# Patient Record
Sex: Female | Born: 1970 | Race: White | Hispanic: Yes | Marital: Single | Smoking: Never smoker
Health system: Southern US, Community
[De-identification: ages and names within clinical notes are randomized; demographics above are authoritative.]

## PROBLEM LIST (undated history)

## (undated) DIAGNOSIS — E119 Type 2 diabetes mellitus without complications: Secondary | ICD-10-CM

## (undated) HISTORY — PX: TUBAL LIGATION: SHX77

---

## 2010-01-17 ENCOUNTER — Inpatient Hospital Stay: Payer: Self-pay | Admitting: Internal Medicine

## 2010-02-11 ENCOUNTER — Ambulatory Visit: Payer: Self-pay | Admitting: Internal Medicine

## 2010-12-08 ENCOUNTER — Emergency Department: Payer: Self-pay | Admitting: Emergency Medicine

## 2011-05-10 ENCOUNTER — Emergency Department: Payer: Self-pay | Admitting: Internal Medicine

## 2011-05-14 ENCOUNTER — Emergency Department: Payer: Self-pay | Admitting: Emergency Medicine

## 2011-05-20 ENCOUNTER — Emergency Department: Payer: Self-pay | Admitting: Emergency Medicine

## 2012-02-10 IMAGING — CT CT CHEST W/ CM
2 series · 16 of 32 positions shown, 20 images · non-contrast
Comparison: none

REASON FOR EXAM: Harpal Danso2
COMMENTS:

[Series 4: soft tissue · axial · 0.70mm/px · z∈[-200,-162]mm · 2 of 85 slices shown]
[im 7/85  mediastinal]
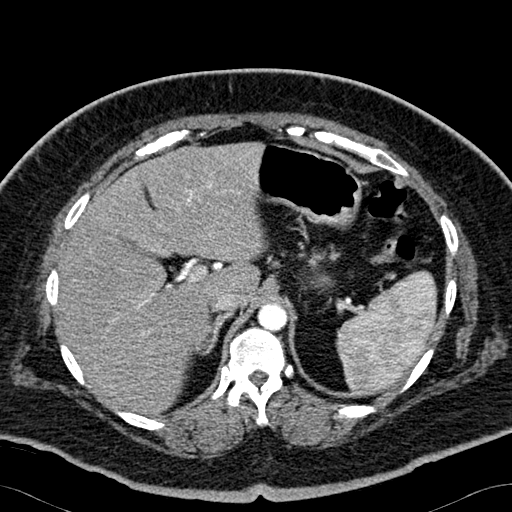
[im 20/85  mediastinal]
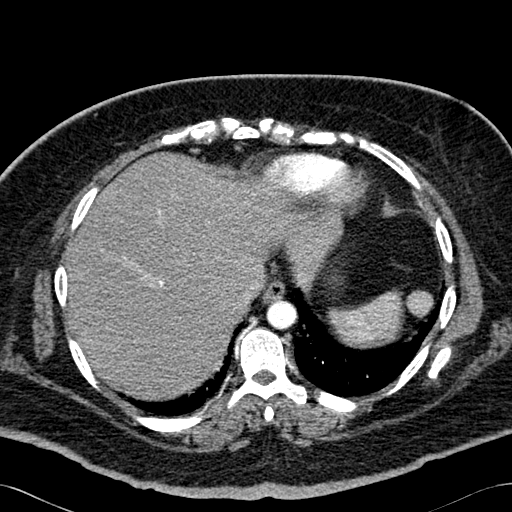

[Series 5: lung windows · axial · 0.70mm/px · z∈[-192,+12]mm · 14 of 82 slices shown, 18 images]
[im 7/82  mediastinal]
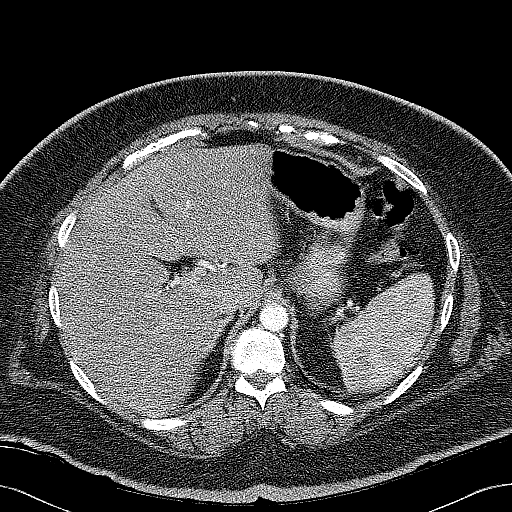
[im 7/82  lung]
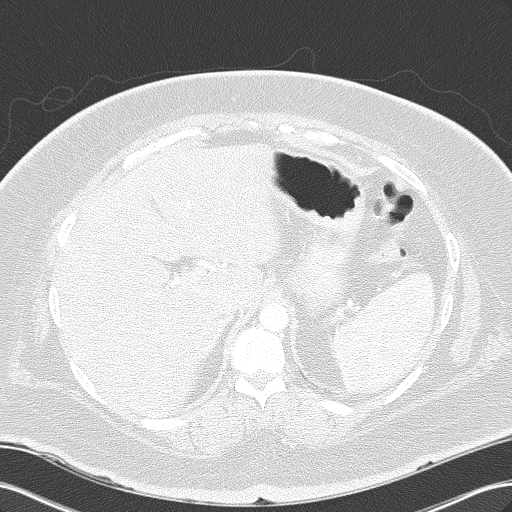
[im 13/82  lung]
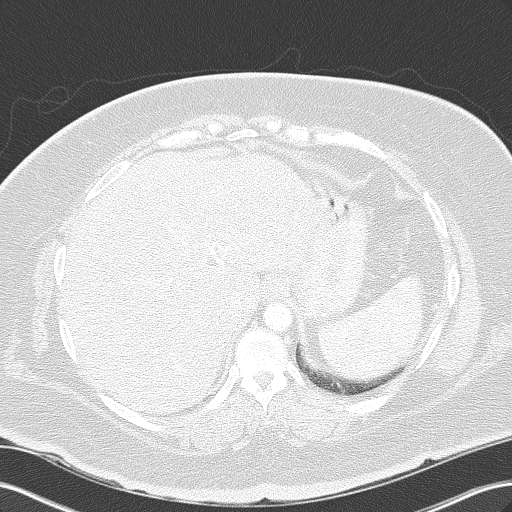
[im 19/82  lung]
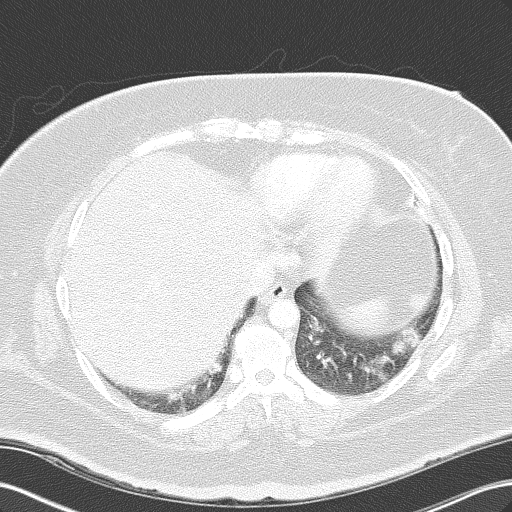
[im 25/82  lung]
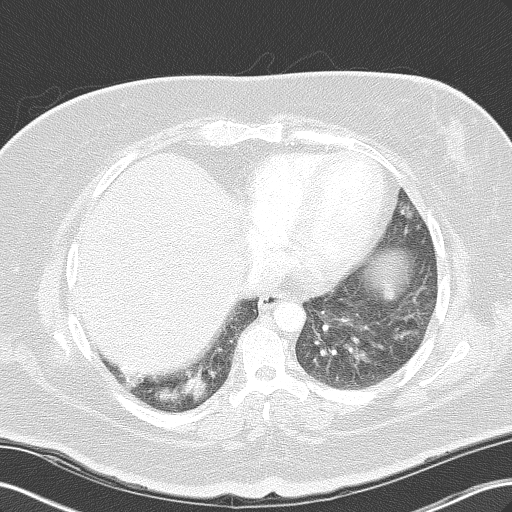
[im 32/82  mediastinal]
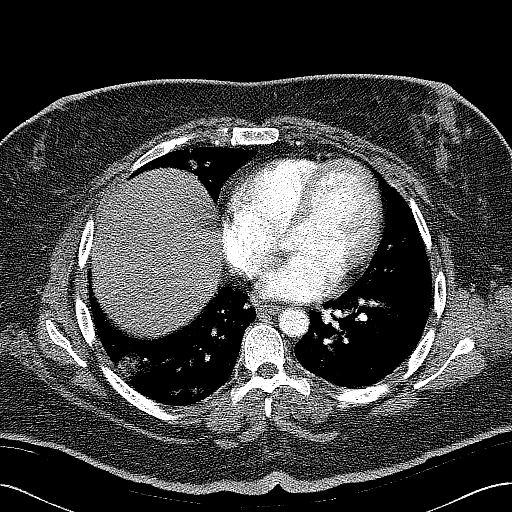
[im 32/82  lung]
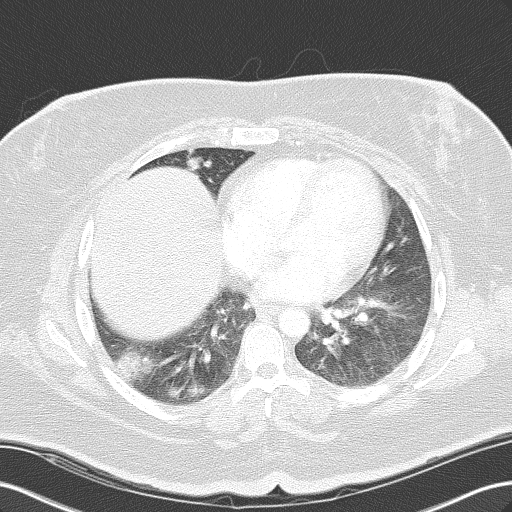
[im 37/82  lung]
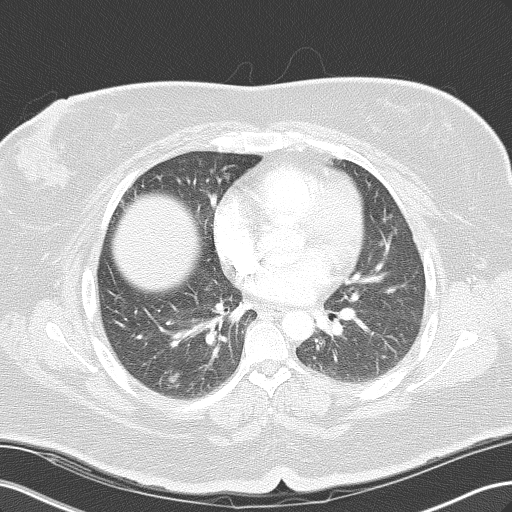
[im 38/82  lung]
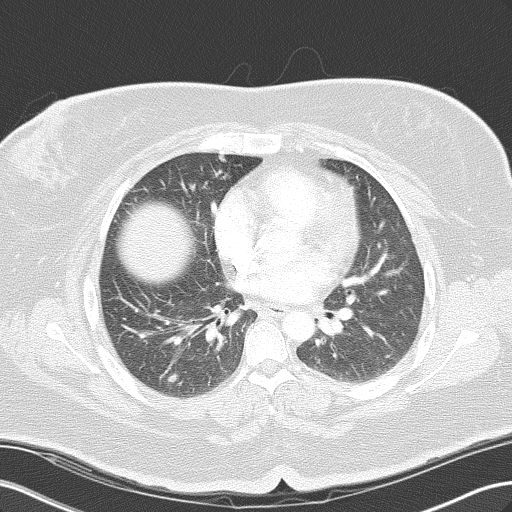
[im 41/82  lung]
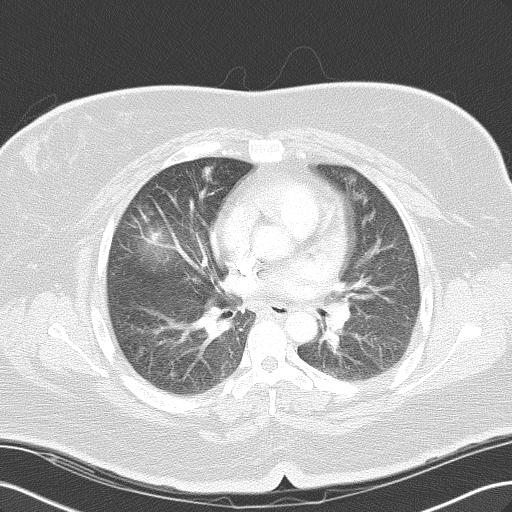
[im 44/82  mediastinal]
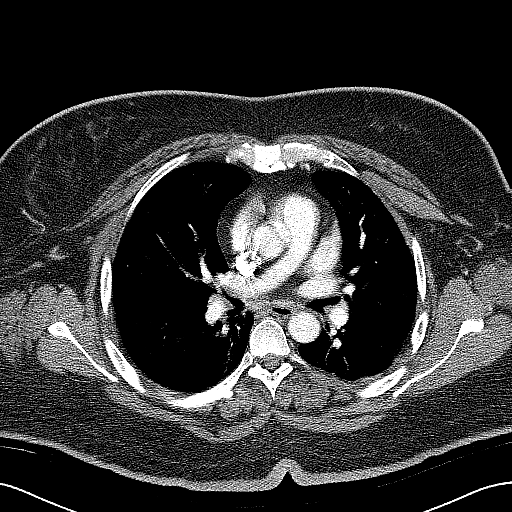
[im 44/82  lung]
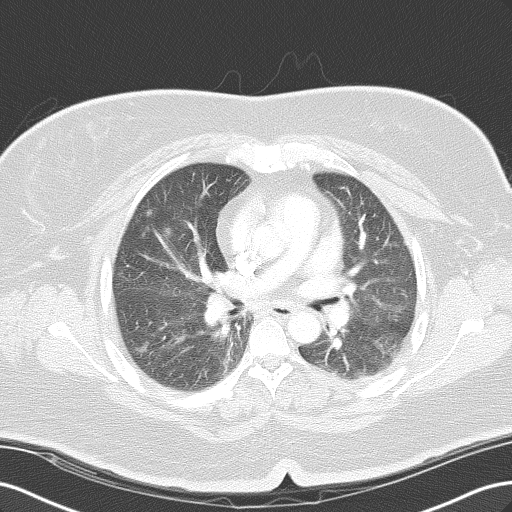
[im 50/82  lung]
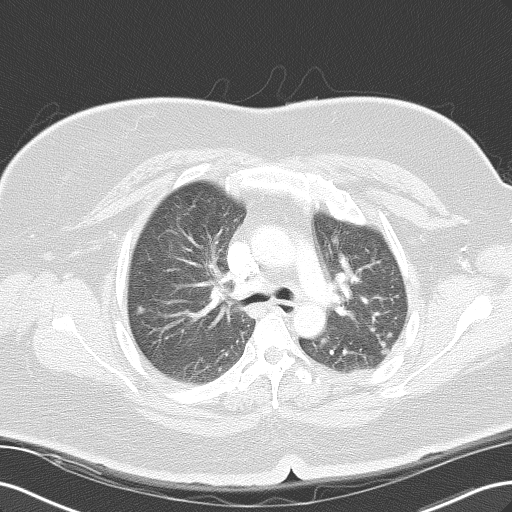
[im 57/82  lung]
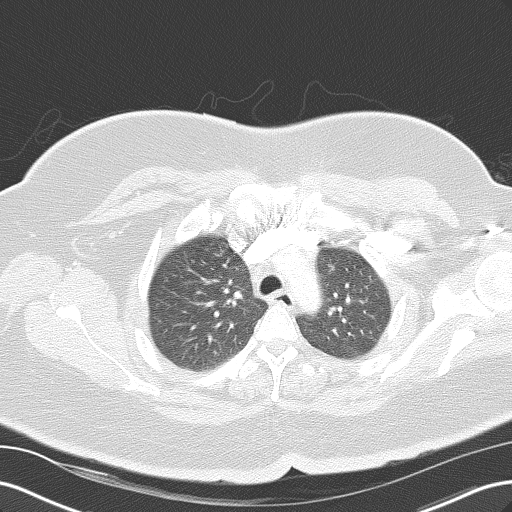
[im 63/82  lung]
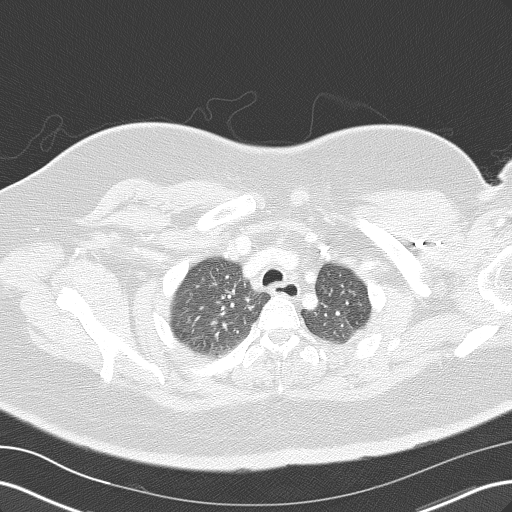
[im 69/82  mediastinal]
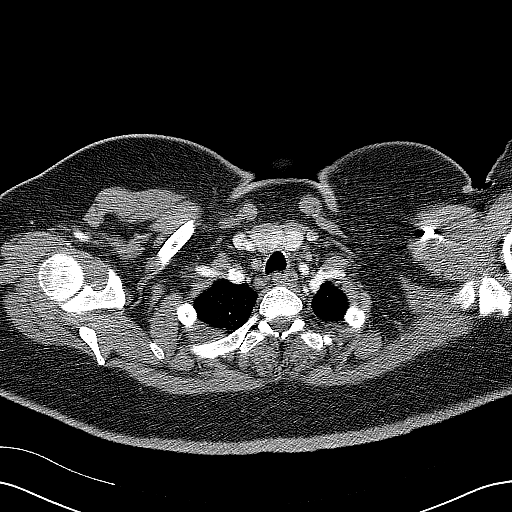
[im 69/82  lung]
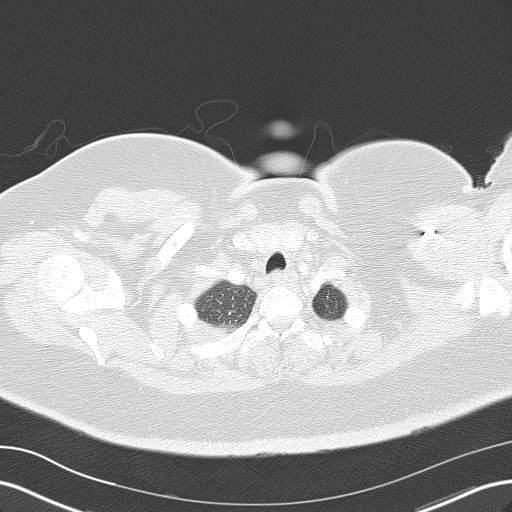
[im 75/82  lung]
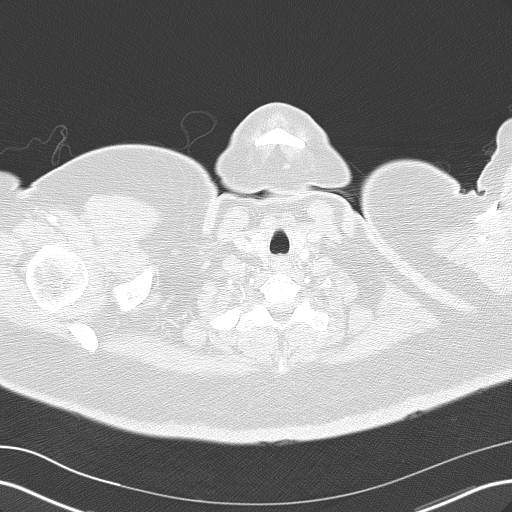

[16 of 32 positions shown; findings below may reference images not displayed]

PROCEDURE:     CT  - CT CHEST (FOR PE) W  - January 17, 2010  [DATE]

RESULT:     History: Hypoxia.

Procedure and Findings: Standard IV contrast enhanced chest CT performed
with 100 cc of Esovue-KYY. Thoracic aorta unremarkable. Pulmonary arteries
are normal. No evidence of pulmonary embolus. Patchy bilateral pulmonary
infiltrates are noted consistent with pneumonia. Active granulomatous
disease disease could also present this fashion.
IMPRESSION: Patchy bilateral pulmonary infiltrates noted consistent
with pneumonia. No pulmonary embolus.

## 2012-02-10 IMAGING — CR DG CHEST 2V
1 series · 2 of 2 positions shown · non-contrast
Comparison: none

REASON FOR EXAM: cough
COMMENTS:

PROCEDURE:     DXR - DXR CHEST PA (OR AP) AND LATERAL  - January 17, 2010  [DATE]
RESULT:     No acute cardiopulmonary disease.

[Series 1: view not recorded · 0.17mm/px · 2 of 2 slices shown]
[im 1/2]
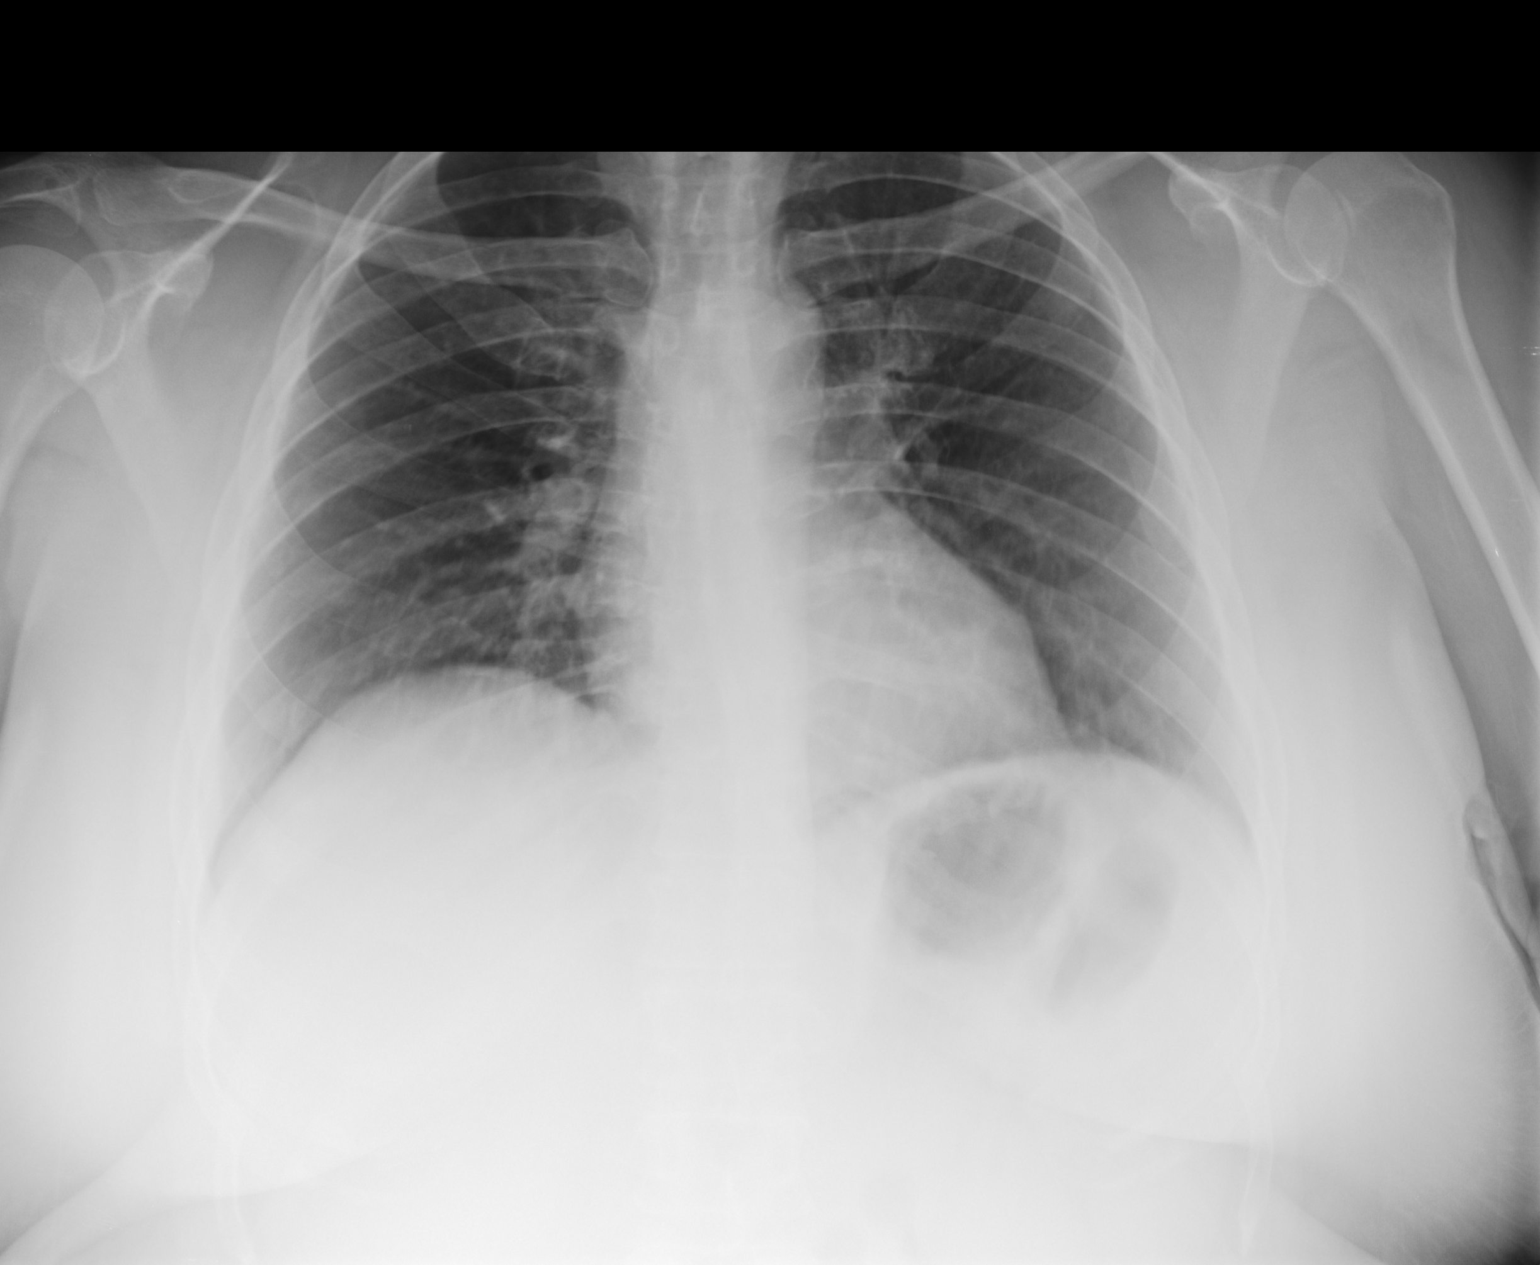
[im 2/2]
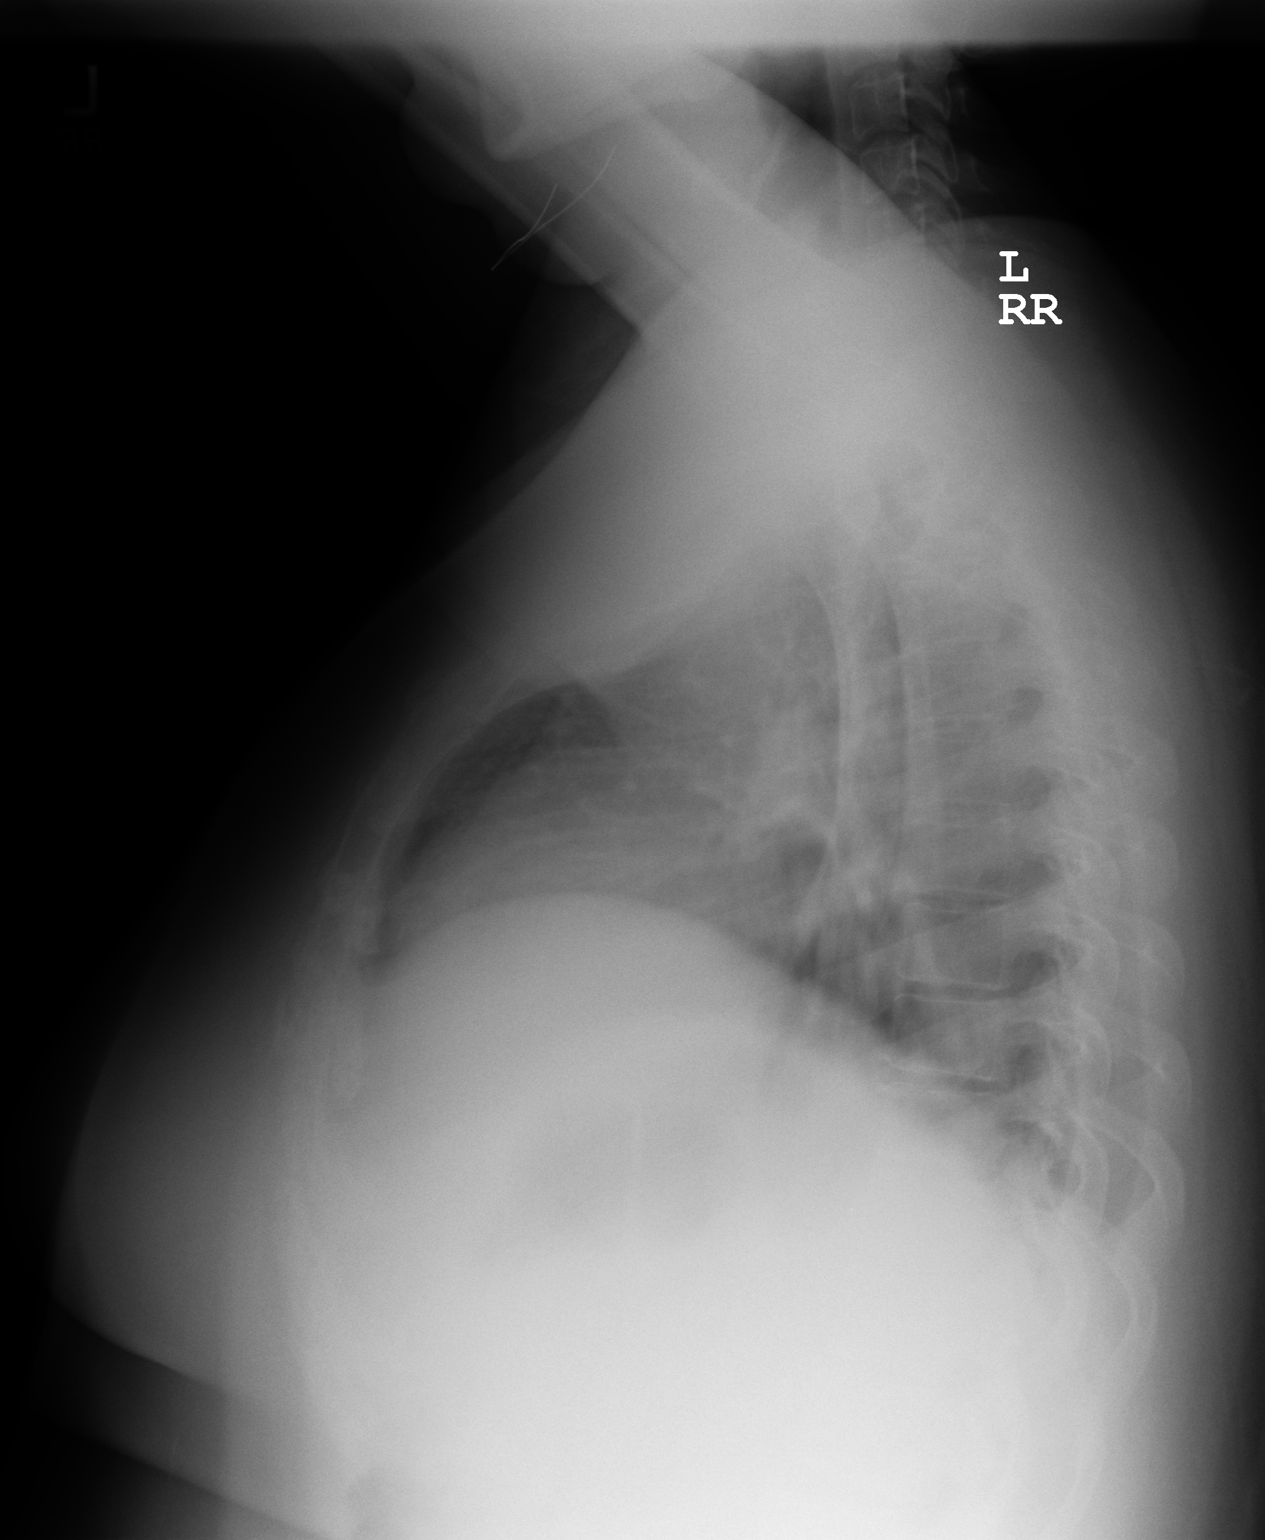

[2 of 2 positions shown; findings below may reference images not displayed]

IMPRESSION: No acute cardiopulmonary disease.

## 2013-06-04 ENCOUNTER — Emergency Department: Payer: Self-pay | Admitting: Emergency Medicine

## 2014-03-19 ENCOUNTER — Emergency Department: Payer: Self-pay | Admitting: Emergency Medicine

## 2015-05-19 ENCOUNTER — Emergency Department
Admission: EM | Admit: 2015-05-19 | Discharge: 2015-05-19 | Disposition: A | Discharge status: Home / Self Care | Payer: Self-pay | Attending: Emergency Medicine | Admitting: Emergency Medicine

## 2015-05-19 ENCOUNTER — Encounter: Payer: Self-pay | Admitting: Emergency Medicine

## 2015-05-19 DIAGNOSIS — L600 Ingrowing nail: Secondary | ICD-10-CM | POA: Insufficient documentation

## 2015-05-19 DIAGNOSIS — E119 Type 2 diabetes mellitus without complications: Secondary | ICD-10-CM | POA: Insufficient documentation

## 2015-05-19 DIAGNOSIS — G629 Polyneuropathy, unspecified: Secondary | ICD-10-CM | POA: Insufficient documentation

## 2015-05-19 DIAGNOSIS — Z79899 Other long term (current) drug therapy: Secondary | ICD-10-CM | POA: Insufficient documentation

## 2015-05-19 HISTORY — DX: Type 2 diabetes mellitus without complications: E11.9

## 2015-05-19 MED ORDER — MELOXICAM 15 MG PO TABS: 15.0000 mg | ORAL_TABLET | Freq: Every day | ORAL | Status: AC

## 2015-05-19 NOTE — ED Provider Notes (Signed)
[facility] Emergency Department Provider Note ____________________________________________  Time seen: Approximately 11:20 PM  I have reviewed the triage vital signs and the nursing notes.   HISTORY  Chief Complaint Toe Pain   HPI Tonya GovernMaria A Page is a 44 y.o. female who presents to the emergency department for bilateral foot pain.  She has an ingrowing nail on the right great toe and burning, numb sensation to the tips of her toes on both feet. She has not taken anything for the pain. She has an appointment with podiatry on 05/30/15.   Past Medical History  Diagnosis Date  . Diabetes mellitus without complication     There are no active problems to display for this patient.   Past Surgical History  Procedure Laterality Date  . Tubal ligation      Current Outpatient Rx  Name  Route  Sig  Dispense  Refill  . glipiZIDE (GLUCOTROL) 10 MG tablet   Oral   Take 10 mg by mouth 2 (two) times daily before a meal.         . METFORMIN HCL PO   Oral   Take by mouth 2 (two) times daily.         . meloxicam (MOBIC) 15 MG tablet   Oral   Take 1 tablet (15 mg total) by mouth daily.   30 tablet   2     Allergies Review of patient's allergies indicates no known allergies.  No family history on file.  Social History History  Substance Use Topics  . Smoking status: Never Smoker   . Smokeless tobacco: Not on file  . Alcohol Use: No    Review of Systems Constitutional: No recent illness. Eyes: No visual changes. ENT: No sore throat. Cardiovascular: Denies chest pain or palpitations. Respiratory: Denies shortness of breath. Gastrointestinal: No abdominal pain.  Genitourinary: Negative for dysuria. Musculoskeletal: Pain in bilateral feet Skin: Negative for rash. Neurological: Negative for headaches, focal weakness or numbness/tingling to bilateral feet, mostly in great toes. 10-point ROS otherwise  negative.  ____________________________________________   PHYSICAL EXAM:  VITAL SIGNS: ED Triage Vitals  Enc Vitals Group     BP 05/19/15 2220 187/93 mmHg     Pulse Rate 05/19/15 2220 103     Resp 05/19/15 2220 18     Temp 05/19/15 2220 98.4 F (36.9 C)     Temp Source 05/19/15 2220 Oral     SpO2 05/19/15 2220 100 %     Weight 05/19/15 2221 198 lb (89.812 kg)     Height 05/19/15 2221 5\' 4"  (1.626 m)     Head Cir --      Peak Flow --      Pain Score 05/19/15 2221 6     Pain Loc --      Pain Edu? --      Excl. in GC? --     Constitutional: Alert and oriented. Well appearing and in no acute distress. Eyes: Conjunctivae are normal. EOMI. Head: Atraumatic. Nose: No congestion/rhinnorhea. Neck: No stridor.  Respiratory: Normal respiratory effort.   Musculoskeletal: Full ROM of feet and toes.  Neurologic:  Normal speech and language. No gross focal neurologic deficits are appreciated. Speech is normal. No gait instability. Sensory intact. Patient able to distinguish between dull and sharp.  Skin:  Skin is warm and dry. Ingrowing nail on right great toe that does not appear infected at this time.  Psychiatric: Mood and affect are normal. Speech and behavior are normal.  ____________________________________________   LABS (all labs ordered are listed, but only abnormal results are displayed)  Labs Reviewed - No data to display ____________________________________________  RADIOLOGY   ____________________________________________   PROCEDURES  Procedure(s) performed: None   ____________________________________________   INITIAL IMPRESSION / ASSESSMENT AND PLAN / ED COURSE  Pertinent labs & imaging results that were available during my care of the patient were reviewed by me and considered in my medical decision making (see chart for details).  Patient was advised to keep the appointment with the podiatrist. She was advised to follow-up with her primary care  provider or return here for any concern of infection in the right great toe. She was advised to monitor her blood sugars and take her diabetic medication as prescribed. ____________________________________________   FINAL CLINICAL IMPRESSION(S) / ED DIAGNOSES  Final diagnoses:  Neuropathy  Ingrowing nail, right great toe      Chinita Pester, FNP 05/20/15 0025  Jene Every, MD 05/20/15 1246

## 2015-05-19 NOTE — ED Notes (Signed)
Patient with complaint of pain to bilateral first toes. Patient also complaint of numbness to bilateral first toes, feet and lower legs. Patient reports that she has has this times one month.

## 2015-05-19 NOTE — Discharge Instructions (Signed)
Neuropata diabtica (Diabetic Neuropathy) La neuropata diabtica es una enfermedad o lesin nerviosa causada por la diabetes mellitus. Alrededor de la mitad de todas las personas que sufren diabetes mellitus tienen alguna forma de lesin nerviosa. Es ms frecuente en aquellas personas que han sufrido diabetes mellitus durante muchos aos y quienes generalmente no tienen buen control del nivel de azcar en la sangre (glucosa). La neuropata diabtica es una complicacin comn de la diabetes mellitus. Hay tres tipos ms comunes de neuropata diiabtica y un cuarto tipo que es menos frecuente y menos comprendido:   Neuropata perifrica: Es el tipo ms frecuente de neuropata diabtica. Causa lesiones en los nervios de los pies y las piernas primero y finalmente en las manos y los brazos.La lesin afecta la capacidad de sentir con el tacto.  Neuropata autonmica: este tipo causa lesiones en el sistema nervioso autnomo, que controla las siguientes funciones:  Los latidos cardacos.  La temperatura corporal.  La presin arterial.  La miccin.  La digestin.  La sudoracin.  La funcin sexual.  Neuropata focal: la neuropata focal puede ser dolorosa e impredecible y ocurre con ms frecuencia en adultos mayores con diabetes mellitus. Implica un nervio especfico o un rea y con frecuencia aparece repentinamente. Generalmente no causa problemas a largo plazo.  Neuropata diabtica proximal: tambin llamada neuropata lumbosacra, afecta los nervios de los muslos, las caderas, las nalgas o las piernas. Es ms frecuente en personas con diabetes mellitus tipo 2 y en hombre mayores. Se caracteriza por dolor debilitante, debilidad y atrofia, generalmente en los msculos de los muslos. CAUSAS  La causa de la neuropata perifrica, autonmica y focal es la diabetes mellitus no controlada y los niveles elevados de glucosa. La causa de la neuropata diabtica proximal no se conoce. Sin embargo, se  considera que la causa es una inflamacin relacionada con los niveles no controlados de glucosa. SIGNOS Y SNTOMAS  Neuropata perifrica La neuropata perifrica se desarrolla lentamente a lo largo del tiempo. Cuando los nervios de los pies y las piernas ya no funcionan puede haber:   Ardor, sensacin pulstil o dolor en las piernas o los pies.  Incapacidad para sentir presin o dolor en el pie. Las consecuencias son:  Callosidades gruesas en las zonas de presin.  Escaras.  lceras.  Deformidades en el pie.  Capacidad reducida para sentir los cambios de temperatura.  Debilidad muscular. Neuropata autnoma Los sntomas varan segn qu nervios estn afectados. Los sntomas pueden ser:  Problemas digestivos como:  Ganas de vomitar (nuseas).  Vmitos.  Hinchazn.  Estreimiento.  Diarrea.  Dolor abdominal.  Dificultad para orinar. Esto ocurre cuando se pierde la capacidad de sentir cuando la vejiga est llena. Los problemas incluyen:  Prdida de orina (incontinencia).  Imposibilidad de vaciar la vejiga completamente (retencin).  Latidos cardacos rpidos o irregulares (palpitaciones).  Descenso brusco de la presin arterial al ponerse de pie (hipotensin ortosttica). Al ponerse de pie puede sentir:  Mareos.  Debilidad.  Desmayos.  En los hombres, imposibilidad de lograr y mantener una ereccin.  En las mujeres, sequedad vaginal y problemas de disminucin del deseo sexual y la excitacin.  Problemas con la regulacin de la temperatura corporal.  Aumento o disminucin de la sudoracin. Neuropata focal  Movimientos oculares anormales o alineacin ocular anormal de ambos ojos.  Debilidad en la mueca.  Pie cado. Esto da como resultado una incapacidad para levantar el pie adecuadamente y una marcha o movimientos del pie anormales.  Parlisis de un lado del rostro (parlisis de Bell).    Dolor en el pecho o dolor abdominal. Neuropata radicular  plexual  Dolor repentino e intenso en la cadera, el muslo o las nalgas.  Debilidad y desgaste de los msculos del muslo.  Dificultad para levantarse desde una posicin de sentado.  Hinchazn abdominal.  Prdida de peso sin motivo (generalmente ms de 10 lb [4,5 kg]). DIAGNSTICO  Neuropata perifrica Le controlarn los sentidos. Las pruebas de funcin sensorial pueden realizarse con:  Un ligero toque usando un monofilamento.  La vibracin con un diapasn.  Sensacin aguda con el pinchazo de un alfiler. Otras pruebas que ayudan a diagnosticar la neuropata son:  Velocidad de conduccin nerviosa. Este estudio controla la transmisin de la corriente elctrica a travs del nervio.  Electromiograma. Muestra de qu modo los msculos responden a las seales elctricas transmitidas por los nervios que los rodean.  Prueba de sensibilidad cuantitativa. Se utiliza para evaluar de que modo los nervios responden a las vibraciones y cambios de temperatura. Neuropata autnoma El diagnstico se basa en los sntomas informados. infrmele al mdico o al profesional de la salud si siente:   Mareos.   Estreimiento.   Diarrea.   Dificultad o imposibilidad de orinar.   Imposibilidad de lograr o mantener una ereccin.  Podrn solicitarle otros estudios, por ejemplo:   Electrocardiograma o monitoreo Holter. Estos estudios muestran si hay problemas con la frecuencia y el ritmo cardacos.   Le harn radiografas. Neuropata focal El diagnstico se basa en los sntomas y en lo que el mdico encuentre durante el examen. Podrn solicitarle otros estudios. Pueden incluir:  Velocidad de conduccin nerviosa. Este estudio controla la transmisin de la corriente elctrica a travs del nervio.  Electromiograma. Muestra de qu modo los msculos responden a las seales elctricas transmitidas por los nervios que los rodean.  Prueba de sensibilidad cuantitativa. Se utiliza para evaluar de que  modo los nervios responden a las vibraciones y cambios de temperatura. Neuropataradicular plexual  Generalmente lo primero es eliminar cualquier otro motivo o problemas que pudieran ser la causa, ya que no hay exmenes para realizar este diagnstico.  Radiografa de la columna vertebral y la regin lumbar.  Puncin lumbar para descartar cncer.  Resonancia magntica para descartar otras lesiones. TRATAMIENTO  Una vez que se produce una lesin nerviosa, no puede ser revertida. El objetivo del tratamiento es impedir que la enfermedad o la lesin nerviosa empeoren y afecten ms nervios. Controlar el nivel de glucosa en la sangre es la clave. La mayora de las personas con neuropata radicular plexual ven al menos una mejora parcial con el tiempo. Deber mantener su nivel de glucosa en sangre y el nivel HbA1c en los valores determinados por su mdico. Los factores que ayudan a controlar el nivel de glucosa en sangre son:   Control del nivel de glucosa en sangre.   Planificacin de los alimentos.   Actividad fsica.   Medicamentos para la diabetes.  Con el tiempo, el mantener bajos los niveles de glucosa en la sangre ayuda a disminuir los sntomas. En algunos casos, se indican analgsicos. INSTRUCCIONES PARA EL CUIDADO EN EL HOGAR:  No fume.  Mantenga su nivel de glucosa en sangre en el rango en el que usted y su mdico han determinado que es aceptable para usted.  Mantenga su nivel de glucosa en la sangre en el rango en el que usted y su mdico han determinado que es aceptable para usted.  Consuma una dieta bien balanceada.  Sea fsicamente activo todos los das.  Revise sus pies todos   los das. SOLICITE ATENCIN MDICA SI:   Siente ardor, sensacin pulstil o dolor en las piernas o los pies.  Tiene incapacidad para sentir presin o dolor en el pie.  Aparecen problemas digestivos como:  Nuseas.  Vmitos.  Hinchazn.  Estreimiento.  Diarrea.  Dolor  abdominal.  Tiene dificultad para orinar como:  incontinencia.  Retencin.  Usted tiene palpitaciones.  Presenta hipotensin ortosttica. Al ponerse de pie puede sentir:  Mareos.  Debilidad.  Desmayarse  No puede lograr tener ni mantener una ereccin (en los hombres).  En las mujeres, sequedad vaginal y problemas de disminucin del deseo sexual y la excitacin.  Siente un dolor intenso en los muslos, las piernas o las nalgas.  Pierde peso de manera inexplicable. Document Released: 10/19/2005 Document Revised: 06/21/2013 [employer] Patient Information 2015 [employer]. This information is not intended to replace advice given to you by your health care provider. Make sure you discuss any questions you have with your health care provider.  

## 2015-05-30 ENCOUNTER — Ambulatory Visit: Payer: Self-pay | Admitting: Podiatry

## 2016-01-06 ENCOUNTER — Telehealth: Payer: Self-pay | Admitting: Internal Medicine

## 2016-01-06 ENCOUNTER — Telehealth: Payer: Self-pay | Admitting: *Deleted

## 2016-01-06 NOTE — Telephone Encounter (Signed)
Not a patient of Dr. Roby LoftsScott's she has not been seen at any [facility].

## 2016-01-06 NOTE — Telephone Encounter (Signed)
Mellody DanceKeith [phone] called from [place] regarding [place] passing away on 01/23/2016. He spoke to Dr Hazle Nordmannashes and death was natural did not appear to be any foul play. Based on pt size and diabetes it presented cardiovascular. All they need is for the death certificate to be signed. Thank you!

## 2016-02-01 DEATH — deceased
